# Patient Record
Sex: Female | Born: 2000
Health system: Southern US, Community
[De-identification: ages and names within clinical notes are randomized; demographics above are authoritative.]

---
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
## 2022-01-04 IMAGING — DX DG SACRUM/COCCYX 2+V
3 series · 3 of 3 positions shown · non-contrast
Comparison: None.

CLINICAL DATA: Coccyx pain.

EXAM:
SACRUM AND COCCYX - 2+ VIEW

[dg sacrum/coccyx (1 of 3)]
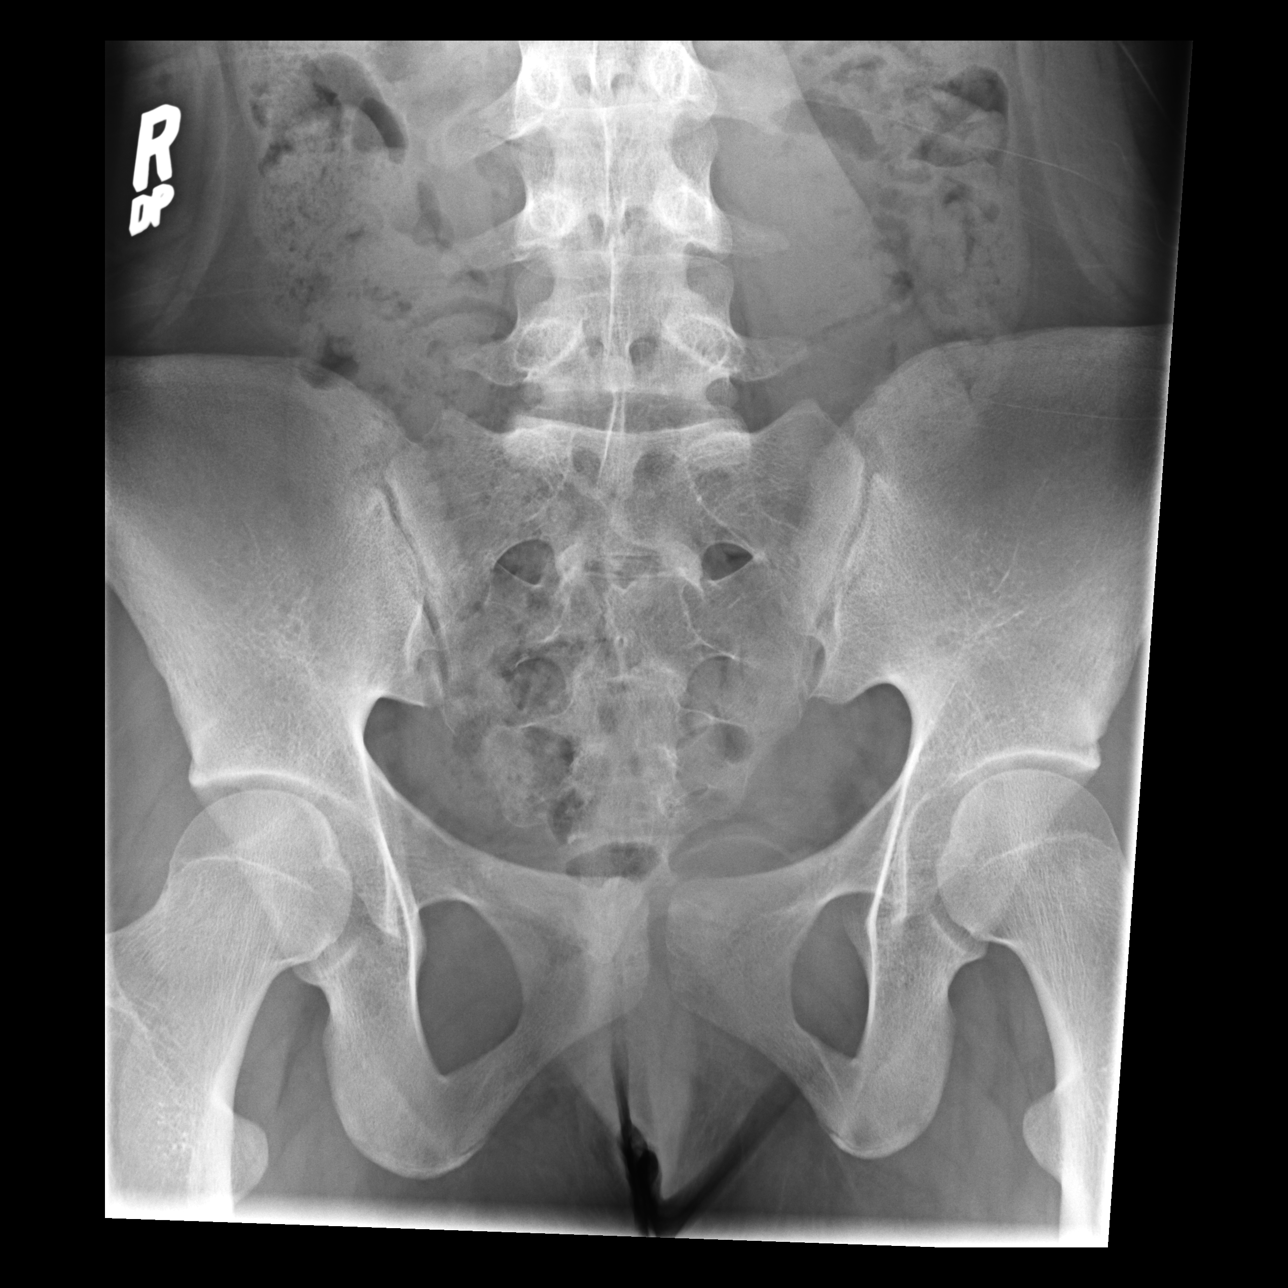

[dg sacrum/coccyx (2 of 3)]
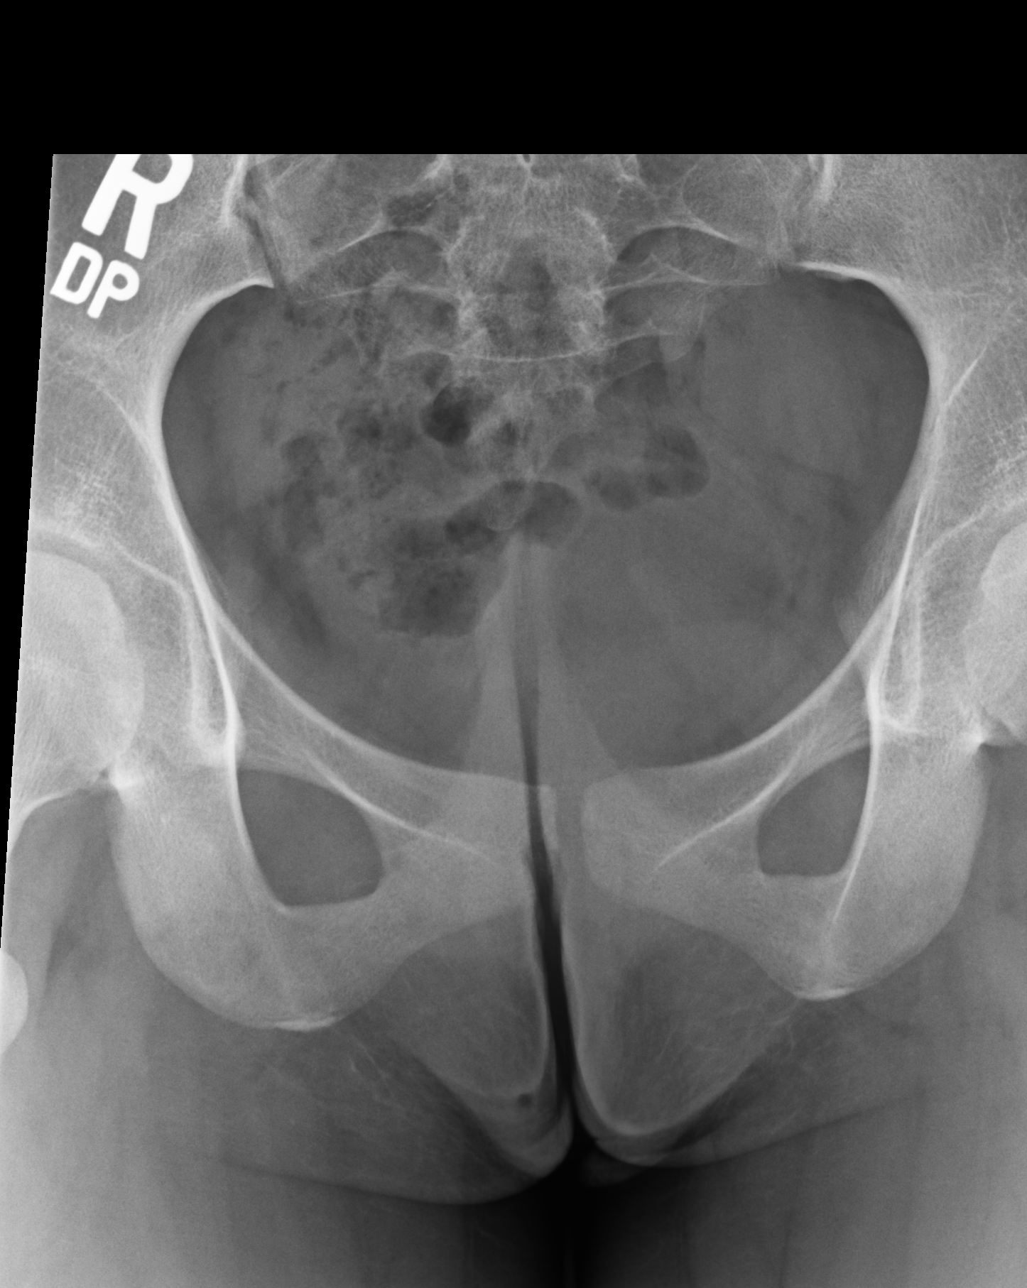

[dg sacrum/coccyx (3 of 3)]
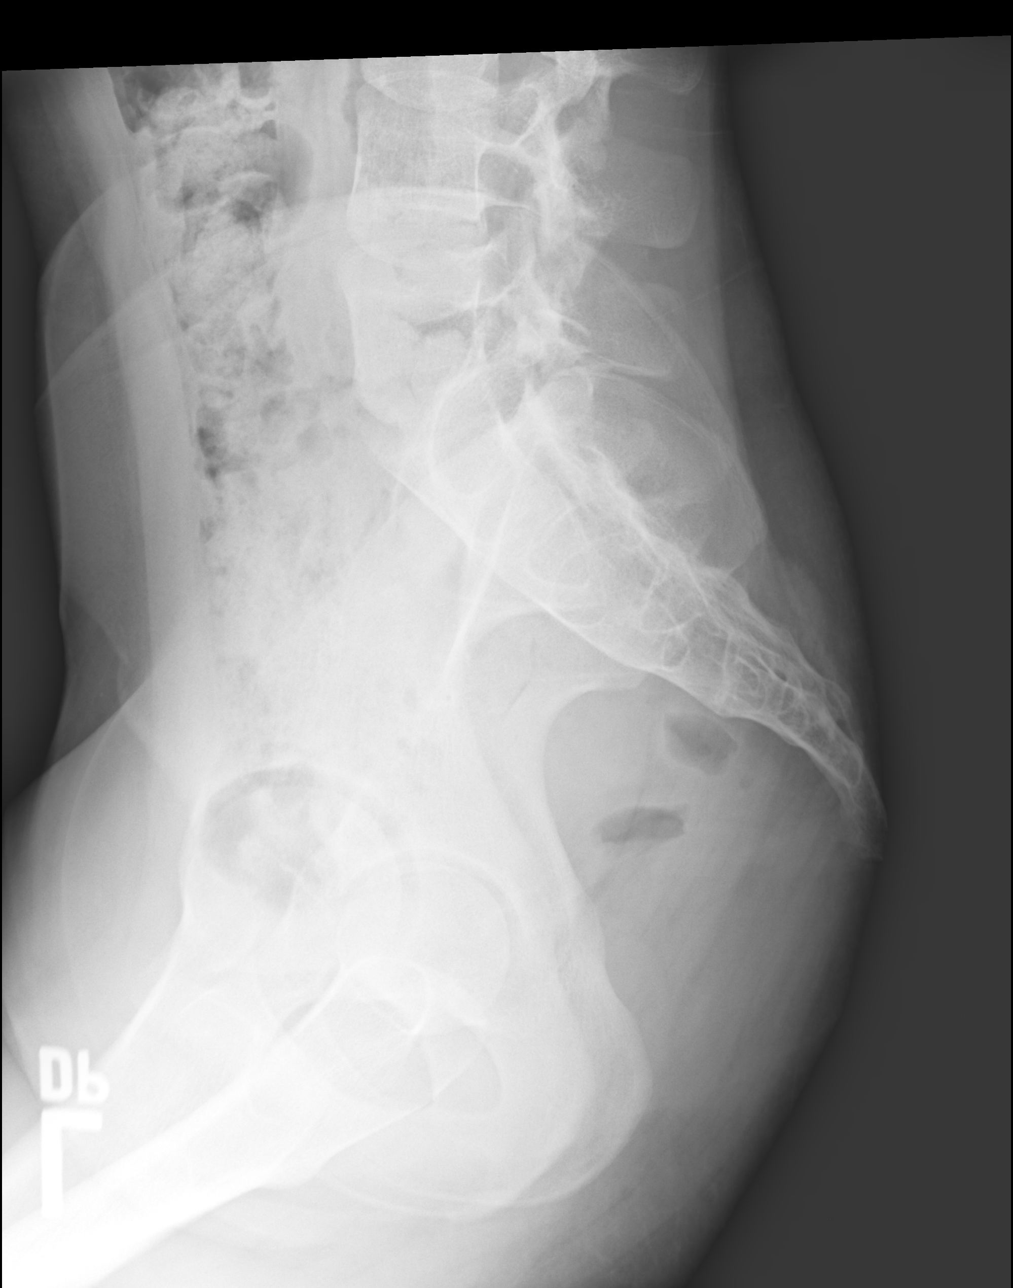

[3 of 3 positions shown; findings below may reference images not displayed]

FINDINGS: There is no evidence of fracture or other focal bone lesions.
IMPRESSION: Negative.

## 2022-01-30 ENCOUNTER — Other Ambulatory Visit: Payer: Self-pay | Admitting: Adult Health

## 2022-01-30 DIAGNOSIS — F428 Other obsessive-compulsive disorder: Secondary | ICD-10-CM

## 2022-01-30 DIAGNOSIS — F331 Major depressive disorder, recurrent, moderate: Secondary | ICD-10-CM

## 2022-01-30 DIAGNOSIS — F410 Panic disorder [episodic paroxysmal anxiety] without agoraphobia: Secondary | ICD-10-CM

## 2022-01-30 DIAGNOSIS — F411 Generalized anxiety disorder: Secondary | ICD-10-CM

## 2022-01-30 DIAGNOSIS — F909 Attention-deficit hyperactivity disorder, unspecified type: Secondary | ICD-10-CM

## 2022-03-23 ENCOUNTER — Telehealth: Payer: Self-pay | Admitting: Adult Health

## 2022-03-23 NOTE — Telephone Encounter (Signed)
Pt's mom LVM @ 4:34p.  She is on DPR.  She asked for refill of Venlafaxine to   [id number]- [place], [place] - [address], [id number]: [phone] Fax: [phone]  Next appt 3/21

## 2022-03-23 NOTE — Telephone Encounter (Signed)
Verify dose.   From 10/31 note: Effexor '75mg'$  daily and 37.'5mg'$ . 75 mg is the only one on her med list.

## 2022-03-24 NOTE — Telephone Encounter (Signed)
Mom called to request RF on venlafaxine. I called to verify dosing.   From last visit 10/31:  Effexor '75mg'$  daily and 37.'5mg'$    Mom said patient took two 75 mg tabs and 1 37.5 mg tab.  It looks like you discontinued the 37.5 mg.  Patient canceled F/U for November, but is scheduled for 3/21.

## 2022-03-24 NOTE — Telephone Encounter (Signed)
Ok - if that is working continue the two 75 mg tabs and 1 37.5 mg tab.

## 2022-03-25 ENCOUNTER — Other Ambulatory Visit: Payer: Self-pay | Admitting: Adult Health

## 2022-03-25 DIAGNOSIS — F411 Generalized anxiety disorder: Secondary | ICD-10-CM

## 2022-03-25 DIAGNOSIS — F428 Other obsessive-compulsive disorder: Secondary | ICD-10-CM

## 2022-03-25 DIAGNOSIS — F909 Attention-deficit hyperactivity disorder, unspecified type: Secondary | ICD-10-CM

## 2022-03-25 DIAGNOSIS — F331 Major depressive disorder, recurrent, moderate: Secondary | ICD-10-CM

## 2022-03-25 DIAGNOSIS — F410 Panic disorder [episodic paroxysmal anxiety] without agoraphobia: Secondary | ICD-10-CM

## 2022-03-25 NOTE — Telephone Encounter (Signed)
Received RF through interface and addressed that request.

## 2022-04-15 ENCOUNTER — Ambulatory Visit (INDEPENDENT_AMBULATORY_CARE_PROVIDER_SITE_OTHER): Payer: 59 | Admitting: Adult Health

## 2022-04-15 ENCOUNTER — Encounter: Payer: Self-pay | Admitting: Adult Health

## 2022-04-15 DIAGNOSIS — F909 Attention-deficit hyperactivity disorder, unspecified type: Secondary | ICD-10-CM

## 2022-04-15 DIAGNOSIS — F428 Other obsessive-compulsive disorder: Secondary | ICD-10-CM

## 2022-04-15 DIAGNOSIS — F410 Panic disorder [episodic paroxysmal anxiety] without agoraphobia: Secondary | ICD-10-CM | POA: Diagnosis not present

## 2022-04-15 DIAGNOSIS — F411 Generalized anxiety disorder: Secondary | ICD-10-CM | POA: Diagnosis not present

## 2022-04-15 DIAGNOSIS — F331 Major depressive disorder, recurrent, moderate: Secondary | ICD-10-CM

## 2022-04-15 MED ORDER — METHYLPHENIDATE HCL 20 MG PO TABS: 20.0000 mg | ORAL_TABLET | Freq: Every evening | ORAL | 0 refills | Status: DC

## 2022-04-15 MED ORDER — VENLAFAXINE HCL ER 75 MG PO CP24: ORAL_CAPSULE | ORAL | 2 refills | Status: DC

## 2022-04-15 NOTE — Progress Notes (Signed)
Madison Wang [id number] 2000/12/17 21 y.o.  Subjective:   Patient ID:  Madison Wang is a 22 y.o. (DOB Jan 29, 2000) female.  Chief Complaint: No chief complaint on file.   HPI Madison Wang presents to the office today for follow-up of GAD, MDD, panic attacks, obssessional thoughts, and ADHD.  Accompanied by mother - not present during interview.  Describes mood today as "ok". Pleasant. Denies tearfulness. Mood symptoms - reports depression, anxiety and irritability. Reports worry, rumination, and over thinking. Feels likes she has spikes of OCD, but it has consistent over the past few weeks. Continues to struggle with situational OCD - obsessive "tics" - "it has spiked". Reports one panic attack - forgot to take medications. Mood is consistent. Stating "I'm not doing as well as I was". Feels like the Effexor is helpful - reports some withdrawal symptoms when missing a dose. Did not tolerate Adderall - "too activating". Improved interest and motivation. Taking medications as prescribed. Energy levels stable. Active, has not been able to exercise like she was - recovering from PNA. Enjoys some usual interests and activities. Single. Not dating. Lives with parents - 2 dogs. Spending time with family and friends. Appetite adequate. Weight stable 133 pounds - 67". Sleeps well most nights. Averages 7 to 8 hours. Reports some daytime napping over the past month. Focus and concentration difficulties - tried Adderall and did not tolerate. Diagnosed with ADD in 3rd grade - re-evaluated in the 8th grade. Never started on medications, but has a 504 plan. Completing tasks. Managing aspects of household. 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  Denies SI or HI.  Denies AH or VH. Denies self harm. Denies substance use.  Previous medication trials: Zoloft  Review of Systems:  Review of Systems  Musculoskeletal:  Negative for gait problem.  Neurological:  Negative for tremors.   Psychiatric/Behavioral:         Please refer to HPI    Medications: I have reviewed the patient's current medications.  Current Outpatient Medications  Medication Sig Dispense Refill   methylphenidate (RITALIN) 20 MG tablet Take 1 tablet (20 mg total) by mouth every evening. 30 tablet 0   amphetamine-dextroamphetamine (ADDERALL) 20 MG tablet Take 1 tablet (20 mg total) by mouth daily. 30 tablet 0   [name] 3-0.02 MG tablet Take 1 tablet by mouth daily.     spironolactone (ALDACTONE) 50 MG tablet Take 50 mg by mouth daily.     venlafaxine XR (EFFEXOR-XR) 75 MG 24 hr capsule Take three capsules every morning. 90 capsule 2   No current facility-administered medications for this visit.    Medication Side Effects: None  Allergies:  Allergies  Allergen Reactions   Penicillin G Rash    Other reaction(s): Other unknown    Amoxicillin Other (See Comments)    unknown Other reaction(s): Rash   Penicillins     No past medical history on file.  Past Medical History, Surgical history, Social history, and Family history were reviewed and updated as appropriate.   Please see review of systems for further details on the patient's review from today.   Objective:   Physical Exam:  There were no vitals taken for this visit.  Physical Exam Constitutional:      General: She is not in acute distress. Musculoskeletal:        General: No deformity.  Neurological:     Mental Status: She is alert and oriented to person, place, and time.  Coordination: Coordination normal.  Psychiatric:        Attention and Perception: Attention and perception normal. She does not perceive auditory or visual hallucinations.        Mood and Affect: Mood normal. Mood is not anxious or depressed. Affect is not labile, blunt, angry or inappropriate.        Speech: Speech normal.        Behavior: Behavior normal.        Thought Content: Thought content normal. Thought content is not paranoid or delusional.  Thought content does not include homicidal or suicidal ideation. Thought content does not include homicidal or suicidal plan.        Cognition and Memory: Cognition and memory normal.        Judgment: Judgment normal.     Comments: Insight intact     Lab Review:  No results found for: "NA", "K", "CL", "CO2", "GLUCOSE", "BUN", "CREATININE", "CALCIUM", "PROT", "ALBUMIN", "AST", "ALT", "ALKPHOS", "BILITOT", "GFRNONAA", "GFRAA"  No results found for: "WBC", "RBC", "HGB", "HCT", "PLT", "MCV", "MCH", "MCHC", "RDW", "LYMPHSABS", "MONOABS", "EOSABS", "BASOSABS"  No results found for: "POCLITH", "LITHIUM"   No results found for: "PHENYTOIN", "PHENOBARB", "VALPROATE", "CBMZ"   .res Assessment: Plan:    Plan:  PDMP reviewed  Effexor 75mg  daily - 2 daily  D/C Adderall 20mg  daily   Consider NAC tabs  Reviewed Gene Sight testing    Consider ADHD medication     RTC 4 weeks  Patient advised to contact office with any questions, adverse effects, or acute worsening in signs and symptoms. Diagnoses and all orders for this visit:  Major depressive disorder, recurrent episode, moderate (HCC) -     venlafaxine XR (EFFEXOR-XR) 75 MG 24 hr capsule; Take three capsules every morning.  Panic attacks -     venlafaxine XR (EFFEXOR-XR) 75 MG 24 hr capsule; Take three capsules every morning.  Generalized anxiety disorder -     venlafaxine XR (EFFEXOR-XR) 75 MG 24 hr capsule; Take three capsules every morning.  Attention deficit hyperactivity disorder (ADHD), unspecified ADHD type -     venlafaxine XR (EFFEXOR-XR) 75 MG 24 hr capsule; Take three capsules every morning. -     methylphenidate (RITALIN) 20 MG tablet; Take 1 tablet (20 mg total) by mouth every evening.  Obsessional thoughts -     venlafaxine XR (EFFEXOR-XR) 75 MG 24 hr capsule; Take three capsules every morning.     Please see After Visit Summary for patient specific instructions.  No future appointments.  No orders of the  defined types were placed in this encounter.   -------------------------------

## 2022-04-18 ENCOUNTER — Telehealth: Payer: Self-pay

## 2022-04-18 NOTE — Telephone Encounter (Signed)
Prior Authorization initiated and submitted for Methylphenidate 20 mg #30/30 approval received effective through 04/17/2025 with [employer]

## 2022-05-13 ENCOUNTER — Other Ambulatory Visit: Payer: Self-pay | Admitting: Adult Health

## 2022-05-13 DIAGNOSIS — F909 Attention-deficit hyperactivity disorder, unspecified type: Secondary | ICD-10-CM

## 2022-05-13 DIAGNOSIS — F428 Other obsessive-compulsive disorder: Secondary | ICD-10-CM

## 2022-05-13 DIAGNOSIS — F410 Panic disorder [episodic paroxysmal anxiety] without agoraphobia: Secondary | ICD-10-CM

## 2022-05-13 DIAGNOSIS — F331 Major depressive disorder, recurrent, moderate: Secondary | ICD-10-CM

## 2022-05-13 DIAGNOSIS — F411 Generalized anxiety disorder: Secondary | ICD-10-CM

## 2022-05-13 NOTE — Telephone Encounter (Signed)
Has appt 4/25

## 2022-05-20 ENCOUNTER — Ambulatory Visit: Payer: 59 | Admitting: Adult Health

## 2022-06-12 ENCOUNTER — Other Ambulatory Visit: Payer: Self-pay | Admitting: Adult Health

## 2022-06-12 DIAGNOSIS — F331 Major depressive disorder, recurrent, moderate: Secondary | ICD-10-CM

## 2022-06-12 DIAGNOSIS — F909 Attention-deficit hyperactivity disorder, unspecified type: Secondary | ICD-10-CM

## 2022-06-12 DIAGNOSIS — F411 Generalized anxiety disorder: Secondary | ICD-10-CM

## 2022-06-12 DIAGNOSIS — F410 Panic disorder [episodic paroxysmal anxiety] without agoraphobia: Secondary | ICD-10-CM

## 2022-06-12 DIAGNOSIS — F428 Other obsessive-compulsive disorder: Secondary | ICD-10-CM

## 2022-06-13 NOTE — Telephone Encounter (Signed)
Please call to schedule appt, was a cancel/no show last visit.

## 2022-06-14 NOTE — Telephone Encounter (Signed)
Lvm for patient to call and schedule 

## 2022-09-13 ENCOUNTER — Other Ambulatory Visit: Payer: Self-pay | Admitting: Adult Health

## 2022-09-13 DIAGNOSIS — F331 Major depressive disorder, recurrent, moderate: Secondary | ICD-10-CM

## 2022-09-13 DIAGNOSIS — F411 Generalized anxiety disorder: Secondary | ICD-10-CM

## 2022-09-13 DIAGNOSIS — F410 Panic disorder [episodic paroxysmal anxiety] without agoraphobia: Secondary | ICD-10-CM

## 2022-09-13 DIAGNOSIS — F909 Attention-deficit hyperactivity disorder, unspecified type: Secondary | ICD-10-CM

## 2022-09-13 DIAGNOSIS — F428 Other obsessive-compulsive disorder: Secondary | ICD-10-CM

## 2022-11-04 ENCOUNTER — Other Ambulatory Visit: Payer: Self-pay | Admitting: Adult Health

## 2022-11-04 DIAGNOSIS — F428 Other obsessive-compulsive disorder: Secondary | ICD-10-CM

## 2022-11-04 DIAGNOSIS — F411 Generalized anxiety disorder: Secondary | ICD-10-CM

## 2022-11-04 DIAGNOSIS — F331 Major depressive disorder, recurrent, moderate: Secondary | ICD-10-CM

## 2022-11-04 DIAGNOSIS — F909 Attention-deficit hyperactivity disorder, unspecified type: Secondary | ICD-10-CM

## 2022-11-04 DIAGNOSIS — F410 Panic disorder [episodic paroxysmal anxiety] without agoraphobia: Secondary | ICD-10-CM

## 2022-11-10 ENCOUNTER — Ambulatory Visit: Payer: 59 | Admitting: Adult Health

## 2022-11-10 ENCOUNTER — Encounter: Payer: Self-pay | Admitting: Adult Health

## 2022-11-10 DIAGNOSIS — F331 Major depressive disorder, recurrent, moderate: Secondary | ICD-10-CM

## 2022-11-10 DIAGNOSIS — F410 Panic disorder [episodic paroxysmal anxiety] without agoraphobia: Secondary | ICD-10-CM

## 2022-11-10 DIAGNOSIS — F411 Generalized anxiety disorder: Secondary | ICD-10-CM

## 2022-11-10 DIAGNOSIS — F909 Attention-deficit hyperactivity disorder, unspecified type: Secondary | ICD-10-CM | POA: Diagnosis not present

## 2022-11-10 DIAGNOSIS — F428 Other obsessive-compulsive disorder: Secondary | ICD-10-CM

## 2022-11-10 MED ORDER — VENLAFAXINE HCL ER 75 MG PO CP24: ORAL_CAPSULE | ORAL | 1 refills | Status: DC

## 2022-11-10 NOTE — Progress Notes (Signed)
Madison Wang [id number] May 29, 2000 22 y.o.  Subjective:   Patient ID:  Madison Wang is a 22 y.o. (DOB 10/02/2000) female.  Chief Complaint: No chief complaint on file.   HPI BRITTON PERKINSON presents to the office today for follow-up of GAD, MDD, panic attacks, obssessional thoughts, and ADHD.  Describes mood today as "ok". Pleasant. Denies tearfulness. Mood symptoms - denies depression and irritability. Reports some anxiety - "irrational thoughts'. Denies panic attacks. Reports decreased worry, rumination, and over thinking - "it comes in waves". Reports obsessional thoughts and acts - "they are better". Mood is consistent. Stating "I feel like I'm doing ok". Feels like the Effexor continues to work well. Improved interest and motivation. Taking medications as prescribed. Energy levels stable. Active, has not been exercising. Enjoys some usual interests and activities. Single. Not dating. Lives with parents - 2 dogs. Spending time with family and friends. Appetite adequate. Weight stable 133 pounds - 67". Sleeps well most nights. Averages 7 to 8 hours. Reports some daytime napping. Focus and concentration difficulties at times. Diagnosed with ADD in 3rd grade - re-evaluated in the 8th grade. Completing tasks. Managing aspects of household. 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  Denies AH or VH. Denies self harm. Denies substance use.  Previous medication trials: Zoloft  Review of Systems:  Review of Systems  Musculoskeletal:  Negative for gait problem.  Neurological:  Negative for tremors.  Psychiatric/Behavioral:         Please refer to HPI    Medications: I have reviewed the patient's current medications.  Current Outpatient Medications  Medication Sig Dispense Refill   methylphenidate (RITALIN) 20 MG tablet Take 1 tablet (20 mg total) by mouth every evening. 30 tablet 0   [name] 3-0.02 MG tablet Take 1 tablet by mouth daily.     spironolactone  (ALDACTONE) 50 MG tablet Take 50 mg by mouth daily.     venlafaxine XR (EFFEXOR-XR) 75 MG 24 hr capsule TAKE 3 CAPSULES BY MOUTH EVERY MORNING 270 capsule 1   No current facility-administered medications for this visit.    Medication Side Effects: None  Allergies:  Allergies  Allergen Reactions   Penicillin G Rash    Other reaction(s): Other unknown    Amoxicillin Other (See Comments)    unknown Other reaction(s): Rash   Penicillins     No past medical history on file.  Past Medical History, Surgical history, Social history, and Family history were reviewed and updated as appropriate.   Please see review of systems for further details on the patient's review from today.   Objective:   Physical Exam:  There were no vitals taken for this visit.  Physical Exam Constitutional:      General: She is not in acute distress. Musculoskeletal:        General: No deformity.  Neurological:     Mental Status: She is alert and oriented to person, place, and time.     Coordination: Coordination normal.  Psychiatric:        Attention and Perception: Attention and perception normal. She does not perceive auditory or visual hallucinations.        Mood and Affect: Mood normal. Mood is not anxious or depressed. Affect is not labile, blunt, angry or inappropriate.        Speech: Speech normal.        Behavior: Behavior normal.        Thought Content: Thought content normal. Thought content  is not paranoid or delusional. Thought content does not include homicidal or suicidal ideation. Thought content does not include homicidal or suicidal plan.        Cognition and Memory: Cognition and memory normal.        Judgment: Judgment normal.     Comments: Insight intact     Lab Review:  No results found for: "NA", "K", "CL", "CO2", "GLUCOSE", "BUN", "CREATININE", "CALCIUM", "PROT", "ALBUMIN", "AST", "ALT", "ALKPHOS", "BILITOT", "GFRNONAA", "GFRAA"  No results found for: "WBC", "RBC", "HGB",  "HCT", "PLT", "MCV", "MCH", "MCHC", "RDW", "LYMPHSABS", "MONOABS", "EOSABS", "BASOSABS"  No results found for: "POCLITH", "LITHIUM"   No results found for: "PHENYTOIN", "PHENOBARB", "VALPROATE", "CBMZ"   .res Assessment: Plan:    Plan:  PDMP reviewed  Effexor XR 75mg  daily - 3 capsules daily  Consider NAC tabs  Reviewed Gene Sight testing    Consider ADHD medication     RTC 3 months  Patient advised to contact office with any questions, adverse effects, or acute worsening in signs and symptoms.  Diagnoses and all orders for this visit:  Major depressive disorder, recurrent episode, moderate (HCC) -     venlafaxine XR (EFFEXOR-XR) 75 MG 24 hr capsule; TAKE 3 CAPSULES BY MOUTH EVERY MORNING  Generalized anxiety disorder -     venlafaxine XR (EFFEXOR-XR) 75 MG 24 hr capsule; TAKE 3 CAPSULES BY MOUTH EVERY MORNING  Panic attacks -     venlafaxine XR (EFFEXOR-XR) 75 MG 24 hr capsule; TAKE 3 CAPSULES BY MOUTH EVERY MORNING  Attention deficit hyperactivity disorder (ADHD), unspecified ADHD type -     venlafaxine XR (EFFEXOR-XR) 75 MG 24 hr capsule; TAKE 3 CAPSULES BY MOUTH EVERY MORNING  Obsessional thoughts -     venlafaxine XR (EFFEXOR-XR) 75 MG 24 hr capsule; TAKE 3 CAPSULES BY MOUTH EVERY MORNING     Please see After Visit Summary for patient specific instructions.  No future appointments.   No orders of the defined types were placed in this encounter.   -------------------------------

## 2023-02-02 ENCOUNTER — Ambulatory Visit (HOSPITAL_COMMUNITY)
Admission: EM | Admit: 2023-02-02 | Discharge: 2023-02-02 | Disposition: A | Discharge status: Home / Self Care | Payer: 59 | Attending: Psychiatry | Admitting: Psychiatry

## 2023-02-02 DIAGNOSIS — F4323 Adjustment disorder with mixed anxiety and depressed mood: Secondary | ICD-10-CM | POA: Diagnosis not present

## 2023-02-02 MED ORDER — LORAZEPAM 1 MG PO TABS
1.0000 mg | ORAL_TABLET | Freq: Once | ORAL | Status: AC
  Administered 2023-02-02: 1 mg via ORAL
  Filled 2023-02-02: qty 1
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
## 2023-02-02 NOTE — Discharge Instructions (Signed)
 You are encouraged to follow up with [facility] for outpatient treatment.  Walk in/ Open Access Hours: Monday - Friday 8AM - 11AM (To see provider and therapist) Friday - 1PM - 4PM (To see therapist only)  [facility] [address] [place], [place] [phone]

## 2023-02-02 NOTE — Progress Notes (Signed)
   02/02/23 1922  [facility] Triage Screening (Walk-ins at [facility] only)  How Did You Hear About Us ? Family/Friend  What Is the Reason for Your Visit/Call Today? Pt presents to [facility] as a voluntary walk-in, with complaint of panic attacks and anxiety. Pt was observed being very tearful, breathing heavily/hyperventilating during triage process. Pt reports that the loss of a friendship/relationship earlier today as immediate stressor. Pt reports history of depression and anxiety. Pt is currently prescribed Venlafaxine  and is compliant with medication. Pt is not established with outpatient services at this time. Pt denies prior psychiatric hospitalizations and/or suicide attempts. Pt currently denies SI,HI,AVH and substance/alcohol use.  How Long Has This Been Causing You Problems? <Week  Have You Recently Had Any Thoughts About Hurting Yourself? No  Are You Planning to Commit Suicide/Harm Yourself At This time? No  Have you Recently Had Thoughts About Hurting Someone Sherral? No  Are You Planning To Harm Someone At This Time? No  Physical Abuse Denies  Verbal Abuse Denies  Sexual Abuse Denies  Exploitation of patient/patient's resources Denies  Self-Neglect Denies  Are you currently experiencing any auditory, visual or other hallucinations? No  Have You Used Any Alcohol or Drugs in the Past 24 Hours? No  Do you have any current medical co-morbidities that require immediate attention? No  Clinician description of patient physical appearance/behavior: very tearful, cooperative  What Do You Feel Would Help You the Most Today? Treatment for Depression or other mood problem  If access to [facility] was not available, would you have sought care in the Emergency Department? No  Determination of Need Routine (7 days)  Options For Referral Outpatient Therapy;Medication Management;Other: Comment

## 2023-02-10 ENCOUNTER — Ambulatory Visit: Payer: Self-pay | Admitting: Adult Health

## 2023-02-10 ENCOUNTER — Ambulatory Visit (INDEPENDENT_AMBULATORY_CARE_PROVIDER_SITE_OTHER): Payer: Self-pay | Admitting: Adult Health

## 2023-02-10 DIAGNOSIS — Z0389 Encounter for observation for other suspected diseases and conditions ruled out: Secondary | ICD-10-CM

## 2023-02-10 NOTE — Progress Notes (Signed)
Patient no show appointment. ? ?

## 2023-02-21 ENCOUNTER — Encounter: Payer: Self-pay | Admitting: Adult Health

## 2023-02-21 ENCOUNTER — Telehealth: Payer: 59 | Admitting: Adult Health

## 2023-02-21 DIAGNOSIS — F331 Major depressive disorder, recurrent, moderate: Secondary | ICD-10-CM

## 2023-02-21 DIAGNOSIS — F909 Attention-deficit hyperactivity disorder, unspecified type: Secondary | ICD-10-CM | POA: Diagnosis not present

## 2023-02-21 DIAGNOSIS — F410 Panic disorder [episodic paroxysmal anxiety] without agoraphobia: Secondary | ICD-10-CM | POA: Diagnosis not present

## 2023-02-21 DIAGNOSIS — F428 Other obsessive-compulsive disorder: Secondary | ICD-10-CM

## 2023-02-21 DIAGNOSIS — F411 Generalized anxiety disorder: Secondary | ICD-10-CM | POA: Diagnosis not present

## 2023-02-21 MED ORDER — VENLAFAXINE HCL ER 75 MG PO CP24: ORAL_CAPSULE | ORAL | 1 refills | Status: DC

## 2023-02-21 MED ORDER — LORAZEPAM 0.5 MG PO TABS: 0.5000 mg | ORAL_TABLET | Freq: Every day | ORAL | 0 refills | Status: DC | PRN

## 2023-02-21 NOTE — Progress Notes (Signed)
Madison Wang [id number] October 28, 2000 22 y.o.  Virtual Visit via Video Note  I connected with pt @ on 02/21/23 at 11:30 AM EST by a video enabled telemedicine application and verified that I am speaking with the correct person using two identifiers.   I discussed the limitations of evaluation and management by telemedicine and the availability of in person appointments. The patient expressed understanding and agreed to proceed.  I discussed the assessment and treatment plan with the patient. The patient was provided an opportunity to ask questions and all were answered. The patient agreed with the plan and demonstrated an understanding of the instructions.   The patient was advised to call back or seek an in-person evaluation if the symptoms worsen or if the condition fails to improve as anticipated.  I provided 25 minutes of non-face-to-face time during this encounter.  The patient was located at home.  The provider was located at [facility].   Dorothyann Gibbs, NP   Subjective:   Patient ID:  Madison Wang is a 24 y.o. (DOB [birth year]) female.  Chief Complaint: No chief complaint on file.   HPI [employer] presents for follow-up of GAD, MDD, panic attacks, obssessional thoughts, and ADHD.  Describes mood today as "ok". Pleasant. Denies tearfulness. Mood symptoms - denies depression and irritability. Reports some anxiety - "irrational thoughts'. Denies panic attacks. Reports decreased worry, rumination, and over thinking - "it comes in waves". Reports obsessional thoughts and acts - "they are better". Mood is consistent. Stating "I feel like I'm doing ok". Feels like the Effexor continues to work well. Improved interest and motivation. Taking medications as prescribed. Energy levels stable. Active, has not been exercising. Enjoys some usual interests and activities. Single. Not dating. Lives with parents - 2 dogs. Spending time with family and friends. Appetite  adequate. Weight stable 133 pounds - 67". Sleeps well most nights. Averages 7 to 8 hours. Reports some daytime napping. Focus and concentration difficulties at times. Diagnosed with ADD in 3rd grade - re-evaluated in the 8th grade. Completing tasks. Managing aspects of household. 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  Denies AH or VH. Denies self harm. Denies substance use.  Previous medication trials: Zoloft   Review of Systems: GAD, MDD, panic attacks, obssessional thoughts, and ADHD.  Describes mood today as "ok". Pleasant. Denies tearfulness. Mood symptoms - denies depression and irritability. Stable interest and motivation. Reports anxiety. Reports recent panic attacks - seen in the ED and treated with Ativan. Reports worry, rumination, and over thinking. Reports some obsessional thoughts and acts. Mood is stable. Stating "I feel like I'm doing alright". Feels like the Effexor continues to work well. Would like to discuss having Ativan as needed for panic attacks. Taking medications as prescribed. Energy levels stable. Active, has not been exercising. Enjoys some usual interests and activities. Single. Lives with parents - 2 dogs. Spending time with family and friends. Appetite adequate. Weight gain 133 to 150 pounds -  67". Sleeps well most nights. Averages 8 to 9 hours.  Focus and concentration mostly stable. Diagnosed with ADD in 3rd grade - re-evaluated in the 8th grade. Completing tasks. Managing aspects of household. [employer] at [employer] and art class.  Denies SI or HI.  Denies AH or VH. Denies self harm. Denies substance use.  Previous medication trials: Zoloft Review of Systems  Musculoskeletal:  Negative for gait problem.  Neurological:  Negative for tremors.  Psychiatric/Behavioral:  Please refer to HPI    Medications: I have reviewed the patient's current medications.  Current Outpatient Medications  Medication Sig Dispense Refill    methylphenidate (RITALIN) 20 MG tablet Take 1 tablet (20 mg total) by mouth every evening. 30 tablet 0   [name] 3-0.02 MG tablet Take 1 tablet by mouth daily.     spironolactone (ALDACTONE) 50 MG tablet Take 50 mg by mouth daily.     venlafaxine XR (EFFEXOR-XR) 75 MG 24 hr capsule TAKE 3 CAPSULES BY MOUTH EVERY MORNING 270 capsule 1   No current facility-administered medications for this visit.    Medication Side Effects: None  Allergies:  Allergies  Allergen Reactions   Penicillin G Rash    Other reaction(s): Other unknown    Amoxicillin Other (See Comments)    unknown Other reaction(s): Rash   Penicillins     No past medical history on file.  No family history on file.  Social History   Socioeconomic History   Marital status: Single    Spouse name: Not on file   Number of children: Not on file   Years of education: Not on file   Highest education level: Not on file  Occupational History   Not on file  Tobacco Use   Smoking status: Never   Smokeless tobacco: Never  Substance and Sexual Activity   Alcohol use: Not on file   Drug use: Not on file   Sexual activity: Not on file  Other Topics Concern   Not on file  Social History Narrative   Not on file   Social Drivers of Health   Financial Resource Strain: Low Risk  (11/09/2021)   Received from [facility], [facility]   Overall Financial Resource Strain (CARDIA)    Difficulty of Paying Living Expenses: Not hard at all  Food Insecurity: Low Risk  (09/14/2022)   Received from [facility]   Hunger Vital Sign    Worried About Running Out of Food in the Last Year: Never true    Ran Out of Food in the Last Year: Never true  Transportation Needs: Not on file (09/14/2022)  Physical Activity: Sufficiently Active (11/09/2021)   Received from [facility], [facility]   Exercise Vital Sign    Days of Exercise per Week: 3 days    Minutes of Exercise per Session: 60 min  Stress: Stress Concern Present  (11/09/2021)   Received from [facility], [facility] - Occupational Stress Questionnaire    Feeling of Stress : To some extent  Social Connections: Socially Integrated (11/09/2021)   Received from [facility], [facility]   Social Network    How would you rate your social network (family, work, friends)?: Good participation with social networks  Intimate Partner Violence: Not At Risk (11/09/2021)   Received from [facility], [facility]   HITS    Over the last 12 months how often did your partner physically hurt you?: Never    Over the last 12 months how often did your partner insult you or talk down to you?: Never    Over the last 12 months how often did your partner threaten you with physical harm?: Never    Over the last 12 months how often did your partner scream or curse at you?: Never    Past Medical History, Surgical history, Social history, and Family history were reviewed and updated as appropriate.   Please see review of systems for further  details on the patient's review from today.   Objective:   Physical Exam:  There were no vitals taken for this visit.  Physical Exam Constitutional:      General: She is not in acute distress. Musculoskeletal:        General: No deformity.  Neurological:     Mental Status: She is alert and oriented to person, place, and time.     Coordination: Coordination normal.  Psychiatric:        Attention and Perception: Attention and perception normal. She does not perceive auditory or visual hallucinations.        Mood and Affect: Mood normal. Mood is not anxious or depressed. Affect is not labile, blunt, angry or inappropriate.        Speech: Speech normal.        Behavior: Behavior normal.        Thought Content: Thought content normal. Thought content is not paranoid or delusional. Thought content does not include homicidal or suicidal ideation. Thought content does not include  homicidal or suicidal plan.        Cognition and Memory: Cognition and memory normal.        Judgment: Judgment normal.     Comments: Insight intact     Lab Review:  No results found for: "NA", "K", "CL", "CO2", "GLUCOSE", "BUN", "CREATININE", "CALCIUM", "PROT", "ALBUMIN", "AST", "ALT", "ALKPHOS", "BILITOT", "GFRNONAA", "GFRAA"  No results found for: "WBC", "RBC", "HGB", "HCT", "PLT", "MCV", "MCH", "MCHC", "RDW", "LYMPHSABS", "MONOABS", "EOSABS", "BASOSABS"  No results found for: "POCLITH", "LITHIUM"   No results found for: "PHENYTOIN", "PHENOBARB", "VALPROATE", "CBMZ"   .res Assessment: Plan:    Plan:  PDMP reviewed  Add Ativan 0.5mg  daily as needed for panic attacks.  Effexor XR 75mg  daily - 3 capsules daily  Consider NAC tabs  Reviewed Gene Sight testing    Consider ADHD medication     RTC 2 months  Patient advised to contact office with any questions, adverse effects, or acute worsening in signs and symptoms.  There are no diagnoses linked to this encounter.   Please see After Visit Summary for patient specific instructions.  No future appointments.  No orders of the defined types were placed in this encounter.     -------------------------------

## 2023-04-11 ENCOUNTER — Other Ambulatory Visit: Payer: Self-pay

## 2023-04-11 ENCOUNTER — Telehealth: Payer: Self-pay | Admitting: Adult Health

## 2023-04-11 DIAGNOSIS — F410 Panic disorder [episodic paroxysmal anxiety] without agoraphobia: Secondary | ICD-10-CM

## 2023-04-11 MED ORDER — LORAZEPAM 0.5 MG PO TABS: 0.5000 mg | ORAL_TABLET | Freq: Every day | ORAL | 0 refills | Status: DC | PRN

## 2023-04-11 NOTE — Telephone Encounter (Signed)
 Lynn-mom called requesting Rx for Ativan to [facility] [address]

## 2023-04-11 NOTE — Telephone Encounter (Signed)
 Pended Ativan to [facility]

## 2023-04-19 ENCOUNTER — Ambulatory Visit: Admitting: Adult Health

## 2023-04-19 ENCOUNTER — Encounter: Payer: Self-pay | Admitting: Adult Health

## 2023-04-19 ENCOUNTER — Telehealth: Admitting: Adult Health

## 2023-04-19 DIAGNOSIS — F410 Panic disorder [episodic paroxysmal anxiety] without agoraphobia: Secondary | ICD-10-CM

## 2023-04-19 DIAGNOSIS — F909 Attention-deficit hyperactivity disorder, unspecified type: Secondary | ICD-10-CM | POA: Diagnosis not present

## 2023-04-19 DIAGNOSIS — F331 Major depressive disorder, recurrent, moderate: Secondary | ICD-10-CM | POA: Diagnosis not present

## 2023-04-19 DIAGNOSIS — F411 Generalized anxiety disorder: Secondary | ICD-10-CM

## 2023-04-19 DIAGNOSIS — F428 Other obsessive-compulsive disorder: Secondary | ICD-10-CM

## 2023-04-19 MED ORDER — VENLAFAXINE HCL ER 75 MG PO CP24: ORAL_CAPSULE | ORAL | 1 refills | Status: DC

## 2023-04-19 MED ORDER — LORAZEPAM 0.5 MG PO TABS: 0.5000 mg | ORAL_TABLET | Freq: Every day | ORAL | 2 refills | Status: DC | PRN

## 2023-04-19 NOTE — Progress Notes (Signed)
 Madison Wang [id number] February 08, 2000 23 y.o.  Virtual Visit via Video Note  I connected with pt @ on 04/19/23 at  5:00 PM EDT by a video enabled telemedicine application and verified that I am speaking with the correct person using two identifiers.   I discussed the limitations of evaluation and management by telemedicine and the availability of in person appointments. The patient expressed understanding and agreed to proceed.  I discussed the assessment and treatment plan with the patient. The patient was provided an opportunity to ask questions and all were answered. The patient agreed with the plan and demonstrated an understanding of the instructions.   The patient was advised to call back or seek an in-person evaluation if the symptoms worsen or if the condition fails to improve as anticipated.  I provided 25 minutes of non-face-to-face time during this encounter.  The patient was located at home.  The provider was located at [facility].   Madison Gibbs, NP   Subjective:   Patient ID:  Madison Wang is a 23 y.o. (DOB 03-Jun-2000) female.  Chief Complaint: No chief complaint on file.   HPI Madison Wang presents for follow-up of GAD, MDD, panic attacks, obssessional thoughts, and ADHD.  Describes mood today as "ok". Pleasant. Reports tearfulness - break up. Mood symptoms - denies depression, anxiety and irritability. Reports varying interest and motivation. Reports one recent panic attack - taking Ativan as needed. Reports increased worry, rumination and over thinking - "more so with recent break up". Reports obsessional thoughts and acts - "I have it at home a lot more". Reports a recent break up. Mood is variable - "up and down". Stating "overall, I feel like I'm doing fine". Feels like the Effexor continues to work well. Taking medications as prescribed. Energy levels lower. Active, has started going back to the gym.  Enjoys some usual interests and activities.  Single. Lives with parents - 2 dogs. Spending time with family and friends. Appetite adequate. Weight gain 133 to 155 pounds - 67". Sleeps well most nights. Averages 9 hours. Reports some daytime napping. Reports stable focus and concentration. Diagnosed with ADD in 3rd grade - re-evaluated in the 8th grade. Completing tasks. Managing aspects of household. Sophomore in college - [employer]. Looking or a job.  Denies SI or HI.  Denies AH or VH. Denies self harm. Denies substance use.  Previous medication trials: Zoloft  Review of Systems:  Review of Systems  Musculoskeletal:  Negative for gait problem.  Neurological:  Negative for tremors.  Psychiatric/Behavioral:         Please refer to HPI    Medications: I have reviewed the patient's current medications.  Current Outpatient Medications  Medication Sig Dispense Refill   LORazepam (ATIVAN) 0.5 MG tablet Take 1 tablet (0.5 mg total) by mouth daily as needed for anxiety. 15 tablet 2   [name] 3-0.02 MG tablet Take 1 tablet by mouth daily.     spironolactone (ALDACTONE) 50 MG tablet Take 50 mg by mouth daily.     venlafaxine XR (EFFEXOR-XR) 75 MG 24 hr capsule TAKE 3 CAPSULES BY MOUTH EVERY MORNING 270 capsule 1   No current facility-administered medications for this visit.    Medication Side Effects: None  Allergies:  Allergies  Allergen Reactions   Penicillin G Rash    Other reaction(s): Other unknown    Amoxicillin Other (See Comments)    unknown Other reaction(s): Rash   Penicillins     No past medical history  on file.  No family history on file.  Social History   Socioeconomic History   Marital status: Single    Spouse name: Not on file   Number of children: Not on file   Years of education: Not on file   Highest education level: Not on file  Occupational History   Not on file  Tobacco Use   Smoking status: Never   Smokeless tobacco: Never  Substance and Sexual Activity   Alcohol use: Not on file   Drug use:  Not on file   Sexual activity: Not on file  Other Topics Concern   Not on file  Social History Narrative   Not on file   Social Drivers of Health   Financial Resource Strain: Low Risk  (11/09/2021)   Received from [facility], [facility]   Overall Financial Resource Strain (CARDIA)    Difficulty of Paying Living Expenses: Not hard at all  Food Insecurity: Low Risk  (09/14/2022)   Received from [facility]   Hunger Vital Sign    Worried About Running Out of Food in the Last Year: Never true    Ran Out of Food in the Last Year: Never true  Transportation Needs: Not on file (09/14/2022)  Physical Activity: Sufficiently Active (11/09/2021)   Received from [facility], [facility]   Exercise Vital Sign    Days of Exercise per Week: 3 days    Minutes of Exercise per Session: 60 min  Stress: Stress Concern Present (11/09/2021)   Received from [facility], [facility] - Occupational Stress Questionnaire    Feeling of Stress : To some extent  Social Connections: Socially Integrated (11/09/2021)   Received from [facility], [facility]   Social Network    How would you rate your social network (family, work, friends)?: Good participation with social networks  Intimate Partner Violence: Not At Risk (11/09/2021)   Received from [facility], [facility]   HITS    Over the last 12 months how often did your partner physically hurt you?: Never    Over the last 12 months how often did your partner insult you or talk down to you?: Never    Over the last 12 months how often did your partner threaten you with physical harm?: Never    Over the last 12 months how often did your partner scream or curse at you?: Never    Past Medical History, Surgical history, Social history, and Family history were reviewed and updated as appropriate.   Please see review of systems for further details on the patient's review from today.    Objective:   Physical Exam:  There were no vitals taken for this visit.  Physical Exam Constitutional:      General: She is not in acute distress. Musculoskeletal:        General: No deformity.  Neurological:     Mental Status: She is alert and oriented to person, place, and time.     Coordination: Coordination normal.  Psychiatric:        Attention and Perception: Attention and perception normal. She does not perceive auditory or visual hallucinations.        Mood and Affect: Affect is not labile, blunt, angry or inappropriate.        Speech: Speech normal.        Behavior: Behavior normal.        Thought Content: Thought content normal. Thought content is not  paranoid or delusional. Thought content does not include homicidal or suicidal ideation. Thought content does not include homicidal or suicidal plan.        Cognition and Memory: Cognition and memory normal.        Judgment: Judgment normal.     Comments: Insight intact     Lab Review:  No results found for: "NA", "K", "CL", "CO2", "GLUCOSE", "BUN", "CREATININE", "CALCIUM", "PROT", "ALBUMIN", "AST", "ALT", "ALKPHOS", "BILITOT", "GFRNONAA", "GFRAA"  No results found for: "WBC", "RBC", "HGB", "HCT", "PLT", "MCV", "MCH", "MCHC", "RDW", "LYMPHSABS", "MONOABS", "EOSABS", "BASOSABS"  No results found for: "POCLITH", "LITHIUM"   No results found for: "PHENYTOIN", "PHENOBARB", "VALPROATE", "CBMZ"   .res Assessment: Plan:    Plan:  PDMP reviewed  Ativan 0.5mg  daily as needed for panic attacks.  Effexor XR 75mg  daily - 3 capsules daily  Reviewed Gene Sight testing    Consider ADHD medication   Working with a therapist.    RTC 2 months  25 minutes spent dedicated to the care of this patient on the date of this encounter to include pre-visit review of records, ordering of medication, post visit documentation, and face-to-face time with the patient discussing GAD, MDD, panic attacks, obssessional thoughts and ADHD.  Discussed continuing current medication regimen.  Discussed potential benefits, risk, and side effects of benzodiazepines to include potential risk of tolerance and dependence, as well as possible drowsiness.  Advised patient not to drive if experiencing drowsiness and to take lowest possible effective dose to minimize risk of dependence and tolerance.   Patient advised to contact office with any questions, adverse effects, or acute worsening in signs and symptoms.  Diagnoses and all orders for this visit:  Major depressive disorder, recurrent episode, moderate (HCC) -     venlafaxine XR (EFFEXOR-XR) 75 MG 24 hr capsule; TAKE 3 CAPSULES BY MOUTH EVERY MORNING  Generalized anxiety disorder -     venlafaxine XR (EFFEXOR-XR) 75 MG 24 hr capsule; TAKE 3 CAPSULES BY MOUTH EVERY MORNING  Panic attacks -     LORazepam (ATIVAN) 0.5 MG tablet; Take 1 tablet (0.5 mg total) by mouth daily as needed for anxiety. -     venlafaxine XR (EFFEXOR-XR) 75 MG 24 hr capsule; TAKE 3 CAPSULES BY MOUTH EVERY MORNING  Attention deficit hyperactivity disorder (ADHD), unspecified ADHD type -     venlafaxine XR (EFFEXOR-XR) 75 MG 24 hr capsule; TAKE 3 CAPSULES BY MOUTH EVERY MORNING  Obsessional thoughts -     venlafaxine XR (EFFEXOR-XR) 75 MG 24 hr capsule; TAKE 3 CAPSULES BY MOUTH EVERY MORNING     Please see After Visit Summary for patient specific instructions.  No future appointments.   No orders of the defined types were placed in this encounter.     -------------------------------

## 2023-05-20 IMAGING — DX DG CHEST 2V
2 series · 2 of 2 positions shown · non-contrast
Comparison: 10/07/2004

CLINICAL DATA: Wheezing.

EXAM:
CHEST - 2 VIEW

[dg chest 2 view (1 of 2)]
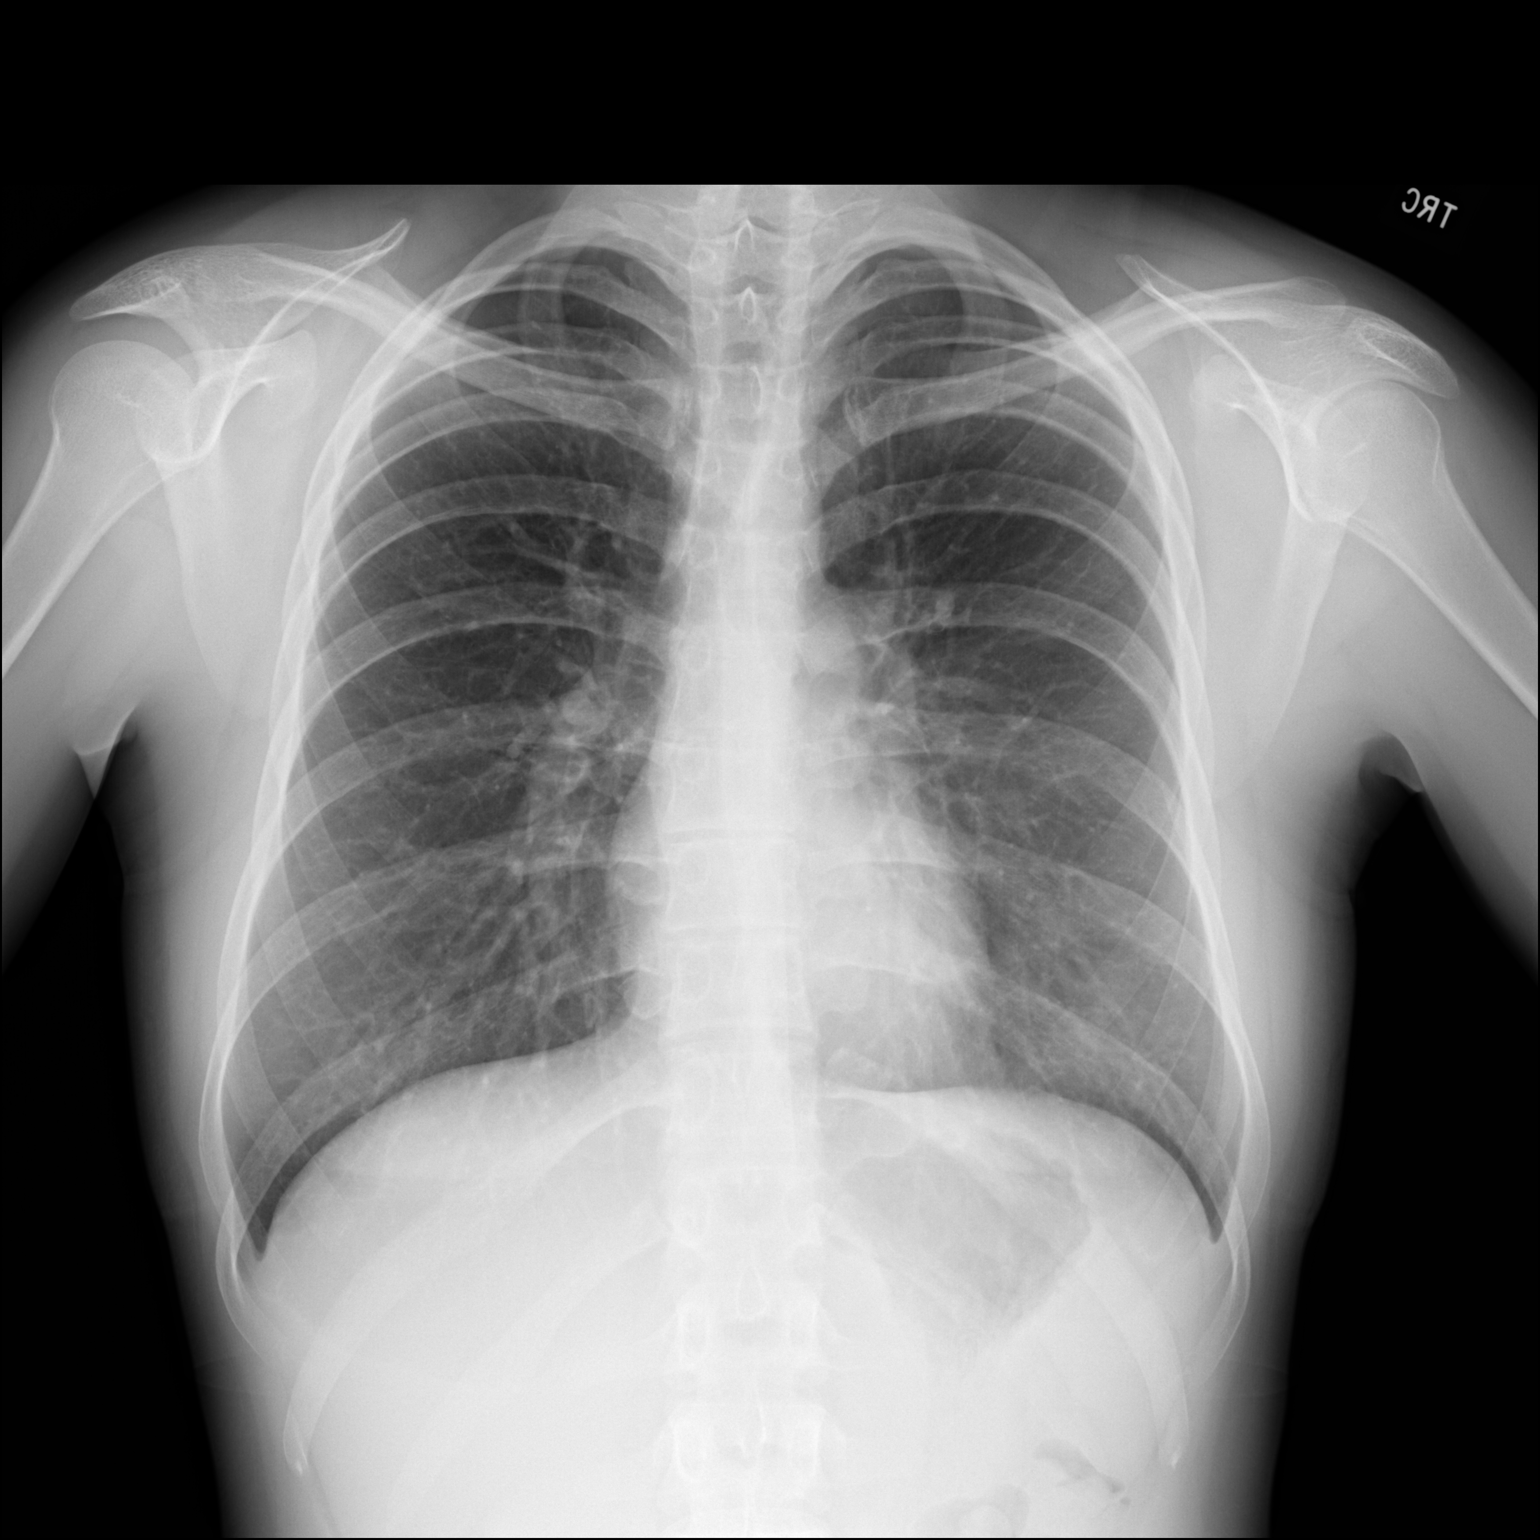

[dg chest 2 view (2 of 2)]
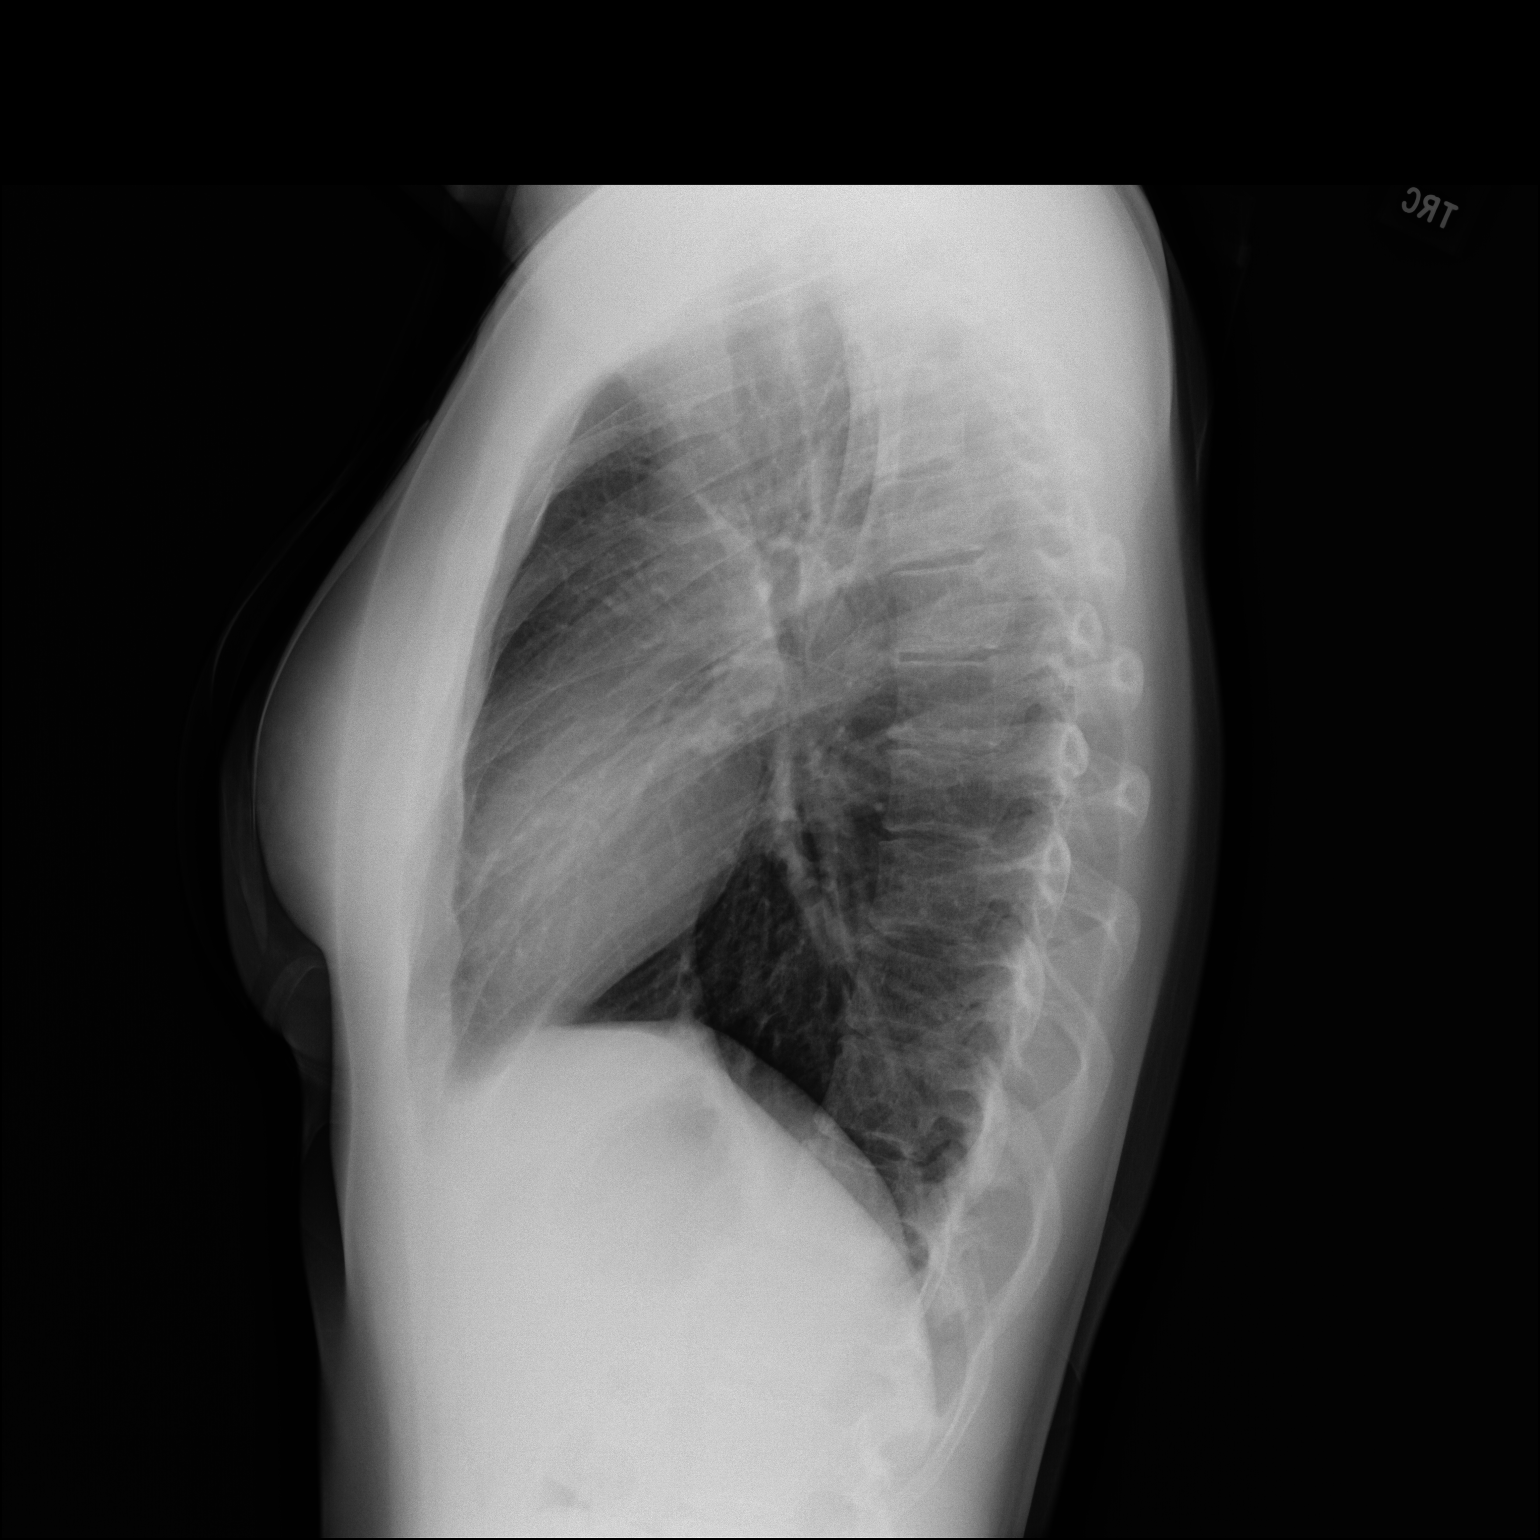

[2 of 2 positions shown; findings below may reference images not displayed]

FINDINGS: The heart size and mediastinal contours are within normal limits.
Both lungs are clear. The visualized skeletal structures are
unremarkable.
IMPRESSION: No active cardiopulmonary disease.

## 2023-05-27 ENCOUNTER — Encounter: Payer: Self-pay | Admitting: Adult Health

## 2023-05-27 ENCOUNTER — Telehealth (INDEPENDENT_AMBULATORY_CARE_PROVIDER_SITE_OTHER): Admitting: Adult Health

## 2023-05-27 DIAGNOSIS — F411 Generalized anxiety disorder: Secondary | ICD-10-CM | POA: Diagnosis not present

## 2023-05-27 DIAGNOSIS — F909 Attention-deficit hyperactivity disorder, unspecified type: Secondary | ICD-10-CM | POA: Diagnosis not present

## 2023-05-27 DIAGNOSIS — F410 Panic disorder [episodic paroxysmal anxiety] without agoraphobia: Secondary | ICD-10-CM

## 2023-05-27 DIAGNOSIS — F331 Major depressive disorder, recurrent, moderate: Secondary | ICD-10-CM | POA: Diagnosis not present

## 2023-05-27 DIAGNOSIS — F428 Other obsessive-compulsive disorder: Secondary | ICD-10-CM

## 2023-05-27 MED ORDER — METHYLPHENIDATE HCL 10 MG PO TABS: 10.0000 mg | ORAL_TABLET | Freq: Two times a day (BID) | ORAL | 0 refills | Status: DC

## 2023-05-27 NOTE — Progress Notes (Signed)
 Madison Wang [id number] [birth year] 22 y.o.  Virtual Visit via Video Note  I connected with pt @ on 05/27/23 at 11:30 AM EDT by a video enabled telemedicine application and verified that I am speaking with the correct person using two identifiers.   I discussed the limitations of evaluation and management by telemedicine and the availability of in person appointments. The patient expressed understanding and agreed to proceed.  I discussed the assessment and treatment plan with the patient. The patient was provided an opportunity to ask questions and all were answered. The patient agreed with the plan and demonstrated an understanding of the instructions.   The patient was advised to call back or seek an in-person evaluation if the symptoms worsen or if the condition fails to improve as anticipated.  I provided 25 minutes of non-face-to-face time during this encounter.  The patient was located at home.  The provider was located at [facility].   Reagan Camera, NP   Subjective:   Patient ID:  Madison Wang is a 23 y.o. (DOB 08/05/2000) female.  Chief Complaint: No chief complaint on file.   HPI [employer] presents for follow-up of GAD, MDD, panic attacks, obssessional thoughts, and ADHD.  Describes mood today as "ok". Pleasant. Reports decreased tearfulness. Mood symptoms - denies depression, anxiety and irritability. Reports stable interest and motivation. Denies recent panic attack.. Reports decreased worry, rumination and over thinking. Reports decreased obsessional thoughts and acts. Reports mood as improved. Stating  I feel like I'm doing better than I was". Taking medications as prescribed. Energy levels lower. Active, has started going back to the gym.  Enjoys some usual interests and activities. Single. Lives with parents - 2 dogs. Spending time with family and friends. Appetite adequate. Weight loss 140 from 155 pounds - 67". Sleeps well most nights.  Averages 9 hours. Reports some daytime napping. Reports stable focus and concentration. Diagnosed with ADD in 3rd grade - re-evaluated in the 8th grade. Completing tasks. Managing aspects of household. Sophomore in college - [employer]. Working 2 jobs. Denies SI or HI.  Denies AH or VH. Denies self harm. Denies substance use.  Previous medication trials: Zoloft    Review of Systems:  Review of Systems  Musculoskeletal:  Negative for gait problem.  Neurological:  Negative for tremors.  Psychiatric/Behavioral:         Please refer to HPI    Medications: I have reviewed the patient's current medications.  Current Outpatient Medications  Medication Sig Dispense Refill   LORazepam  (ATIVAN ) 0.5 MG tablet Take 1 tablet (0.5 mg total) by mouth daily as needed for anxiety. 15 tablet 2   [name] 3-0.02 MG tablet Take 1 tablet by mouth daily.     spironolactone (ALDACTONE) 50 MG tablet Take 50 mg by mouth daily.     venlafaxine  XR (EFFEXOR -XR) 75 MG 24 hr capsule TAKE 3 CAPSULES BY MOUTH EVERY MORNING 270 capsule 1   No current facility-administered medications for this visit.    Medication Side Effects: None  Allergies:  Allergies  Allergen Reactions   Penicillin G Rash    Other reaction(s): Other unknown    Amoxicillin Other (See Comments)    unknown Other reaction(s): Rash   Penicillins     No past medical history on file.  No family history on file.  Social History   Socioeconomic History   Marital status: Single    Spouse name: Not on file   Number of children: Not on file  Years of education: Not on file   Highest education level: Not on file  Occupational History   Not on file  Tobacco Use   Smoking status: Never   Smokeless tobacco: Never  Substance and Sexual Activity   Alcohol use: Not on file   Drug use: Not on file   Sexual activity: Not on file  Other Topics Concern   Not on file  Social History Narrative   Not on file   Social Drivers of Health    Financial Resource Strain: Low Risk  (11/09/2021)   Received from [facility], [facility]   Overall Financial Resource Strain (CARDIA)    Difficulty of Paying Living Expenses: Not hard at all  Food Insecurity: Low Risk  (09/14/2022)   Received from [facility]   Hunger Vital Sign    Worried About Running Out of Food in the Last Year: Never true    Ran Out of Food in the Last Year: Never true  Transportation Needs: Not on file (09/14/2022)  Physical Activity: Sufficiently Active (11/09/2021)   Received from [facility], [facility]   Exercise Vital Sign    Days of Exercise per Week: 3 days    Minutes of Exercise per Session: 60 min  Stress: Stress Concern Present (11/09/2021)   Received from [facility], [facility] - Occupational Stress Questionnaire    Feeling of Stress : To some extent  Social Connections: Socially Integrated (11/09/2021)   Received from [facility], [facility]   Social Network    How would you rate your social network (family, work, friends)?: Good participation with social networks  Intimate Partner Violence: Not At Risk (11/09/2021)   Received from [facility], [facility]   HITS    Over the last 12 months how often did your partner physically hurt you?: Never    Over the last 12 months how often did your partner insult you or talk down to you?: Never    Over the last 12 months how often did your partner threaten you with physical harm?: Never    Over the last 12 months how often did your partner scream or curse at you?: Never    Past Medical History, Surgical history, Social history, and Family history were reviewed and updated as appropriate.   Please see review of systems for further details on the patient's review from today.   Objective:   Physical Exam:  There were no vitals taken for this visit.  Physical Exam Constitutional:      General: She is not in acute  distress. Musculoskeletal:        General: No deformity.  Neurological:     Mental Status: She is alert and oriented to person, place, and time.     Coordination: Coordination normal.  Psychiatric:        Attention and Perception: Attention and perception normal. She does not perceive auditory or visual hallucinations.        Mood and Affect: Mood normal. Affect is not labile, blunt, angry or inappropriate.        Speech: Speech normal.        Behavior: Behavior normal.        Thought Content: Thought content normal. Thought content is not paranoid or delusional. Thought content does not include homicidal or suicidal ideation. Thought content does not include homicidal or suicidal plan.        Cognition and Memory: Cognition and memory normal.  Judgment: Judgment normal.     Comments: Insight intact     Lab Review:  No results found for: "NA", "K", "CL", "CO2", "GLUCOSE", "BUN", "CREATININE", "CALCIUM", "PROT", "ALBUMIN", "AST", "ALT", "ALKPHOS", "BILITOT", "GFRNONAA", "GFRAA"  No results found for: "WBC", "RBC", "HGB", "HCT", "PLT", "MCV", "MCH", "MCHC", "RDW", "LYMPHSABS", "MONOABS", "EOSABS", "BASOSABS"  No results found for: "POCLITH", "LITHIUM"   No results found for: "PHENYTOIN", "PHENOBARB", "VALPROATE", "CBMZ"   .res Assessment: Plan:    Plan:  PDMP reviewed  Add Ritalin  10mg  daily - start with a 1/2 tablet daily. Reviewed Gene Sight testing    Ativan  0.5mg  daily as needed for panic attacks - rarely uses.  Effexor  XR 75mg  daily - 3 capsules daily  Working with a therapist.    RTC 2 months  25 minutes spent dedicated to the care of this patient on the date of this encounter to include pre-visit review of records, ordering of medication, post visit documentation, and face-to-face time with the patient discussing GAD, MDD, panic attacks, obssessional thoughts and ADHD. Discussed continuing current medication regimen.  Discussed potential benefits, risk, and  side effects of benzodiazepines to include potential risk of tolerance and dependence, as well as possible drowsiness.  Advised patient not to drive if experiencing drowsiness and to take lowest possible effective dose to minimize risk of dependence and tolerance.   Patient advised to contact office with any questions, adverse effects, or acute worsening in signs and symptoms.  There are no diagnoses linked to this encounter.   Please see After Visit Summary for patient specific instructions.  Future Appointments  Date Time Provider Department Center  05/27/2023 11:30 AM Chenell Lozon Nattalie, NP [facility] None    No orders of the defined types were placed in this encounter.     -------------------------------

## 2023-08-04 ENCOUNTER — Other Ambulatory Visit: Payer: Self-pay | Admitting: Adult Health

## 2023-08-04 DIAGNOSIS — F411 Generalized anxiety disorder: Secondary | ICD-10-CM

## 2023-08-04 DIAGNOSIS — F428 Other obsessive-compulsive disorder: Secondary | ICD-10-CM

## 2023-08-04 DIAGNOSIS — F331 Major depressive disorder, recurrent, moderate: Secondary | ICD-10-CM

## 2023-08-04 DIAGNOSIS — F410 Panic disorder [episodic paroxysmal anxiety] without agoraphobia: Secondary | ICD-10-CM

## 2023-08-04 DIAGNOSIS — F909 Attention-deficit hyperactivity disorder, unspecified type: Secondary | ICD-10-CM

## 2023-08-04 MED ORDER — VENLAFAXINE HCL ER 75 MG PO CP24: ORAL_CAPSULE | ORAL | 0 refills | Status: DC

## 2023-09-05 ENCOUNTER — Telehealth: Payer: Self-pay | Admitting: Adult Health

## 2023-09-05 NOTE — Telephone Encounter (Signed)
 Patient lvm at 4:25 needing a emotional support letter for a cat. PH: [phone]

## 2023-09-05 NOTE — Telephone Encounter (Signed)
 Pt called asking provider to return her call. Needs letter concerning her health. Contact # [phone]

## 2023-09-05 NOTE — Telephone Encounter (Signed)
 Addressed in another message. Is asking for ESA letter.

## 2023-09-05 NOTE — Telephone Encounter (Signed)
 Patient is asking for a ESA letter for her cat for housing.   Spayed female cat 1-YO Ragdoll Madison Wang  She will pick up letter when ready.

## 2023-09-05 NOTE — Telephone Encounter (Signed)
 error

## 2023-09-05 NOTE — Telephone Encounter (Signed)
 LVM to [facility]

## 2023-09-07 NOTE — Telephone Encounter (Signed)
 done

## 2023-09-07 NOTE — Telephone Encounter (Signed)
 Pt's mom called at 3:37p.  I advised per Lauraine that the letter is not ready but will be by tomorrow.  Pt's mom said she will come tomorrow.

## 2023-09-09 ENCOUNTER — Telehealth (INDEPENDENT_AMBULATORY_CARE_PROVIDER_SITE_OTHER): Admitting: Adult Health

## 2023-09-09 ENCOUNTER — Encounter: Payer: Self-pay | Admitting: Adult Health

## 2023-09-09 DIAGNOSIS — F331 Major depressive disorder, recurrent, moderate: Secondary | ICD-10-CM | POA: Diagnosis not present

## 2023-09-09 DIAGNOSIS — F909 Attention-deficit hyperactivity disorder, unspecified type: Secondary | ICD-10-CM

## 2023-09-09 DIAGNOSIS — F410 Panic disorder [episodic paroxysmal anxiety] without agoraphobia: Secondary | ICD-10-CM | POA: Diagnosis not present

## 2023-09-09 DIAGNOSIS — F411 Generalized anxiety disorder: Secondary | ICD-10-CM | POA: Diagnosis not present

## 2023-09-09 DIAGNOSIS — F428 Other obsessive-compulsive disorder: Secondary | ICD-10-CM

## 2023-09-09 MED ORDER — AMPHETAMINE-DEXTROAMPHETAMINE 10 MG PO TABS: 10.0000 mg | ORAL_TABLET | Freq: Every day | ORAL | 0 refills | Status: AC

## 2023-09-09 MED ORDER — LORAZEPAM 0.5 MG PO TABS
0.5000 mg | ORAL_TABLET | Freq: Every day | ORAL | 2 refills | Status: AC | PRN
Start: 2023-09-09 — End: ?

## 2023-09-09 MED ORDER — VENLAFAXINE HCL ER 75 MG PO CP24: ORAL_CAPSULE | ORAL | 1 refills | Status: AC

## 2023-09-09 NOTE — Progress Notes (Signed)
 Madison Wang [id number] 2000-12-06 23 y.o.  Virtual Visit via Video Note  I connected with pt @ on 09/09/23 at  3:00 PM EDT by a video enabled telemedicine application and verified that I am speaking with the correct person using two identifiers.   I discussed the limitations of evaluation and management by telemedicine and the availability of in person appointments. The patient expressed understanding and agreed to proceed.  I discussed the assessment and treatment plan with the patient. The patient was provided an opportunity to ask questions and all were answered. The patient agreed with the plan and demonstrated an understanding of the instructions.   The patient was advised to call back or seek an in-person evaluation if the symptoms worsen or if the condition fails to improve as anticipated.  I provided 25 minutes of non-face-to-face time during this encounter.  The patient was located at home.  The provider was located at [facility].   Angeline LOISE Sayers, NP   Subjective:   Patient ID:  Madison Wang is a 23 y.o. (DOB Mar 22, 2000) female.  Chief Complaint: No chief complaint on file.   HPI [employer] presents for follow-up of GAD, MDD, panic attacks, obssessional thoughts, and ADHD.  Describes mood today as ok. Pleasant. Reports decreased tearfulness. Mood symptoms - denies depression and irritability. Reports situational anxiety. Reports stable interest and motivation. Reports one recent panic attack. Reports some worry, rumination and over thinking. Reports decreased obsessional thoughts and acts. Reports some tics - happens more with stress. Reports mood as stable. Stating I feel like I'm doing ok - feeling pretty positive. Taking medications as prescribed. Energy levels stable. Active, plans to start exercising. Enjoys some usual interests and activities. Single. Lives with parents - 2 dogs. Spending time with family and friends. Appetite adequate.  Weight stable - 150's - 67. Sleeps well most nights. Averages 9 hours. Reports some daytime napping. Reports difficulties with  focus and concentration. Diagnosed with ADD in 3rd grade - re-evaluated in the 8th grade. Completing tasks. Managing aspects of household. Plans to attend [employer] - online. Working full time - pre school. Denies SI or HI.  Denies AH or VH. Denies self harm. Denies substance use.  Previous medication trials: Zoloft   Review of Systems:  Review of Systems  Musculoskeletal:  Negative for gait problem.  Neurological:  Negative for tremors.  Psychiatric/Behavioral:         Please refer to HPI   Medications: I have reviewed the patient's current medications.  Current Outpatient Medications  Medication Sig Dispense Refill   amphetamine -dextroamphetamine  (ADDERALL) 10 MG tablet Take 1 tablet (10 mg total) by mouth daily. 30 tablet 0   LORazepam  (ATIVAN ) 0.5 MG tablet Take 1 tablet (0.5 mg total) by mouth daily as needed for anxiety. 15 tablet 2   [name] 3-0.02 MG tablet Take 1 tablet by mouth daily.     spironolactone (ALDACTONE) 50 MG tablet Take 50 mg by mouth daily.     venlafaxine  XR (EFFEXOR -XR) 75 MG 24 hr capsule TAKE 3 CAPSULES BY MOUTH EVERY MORNING 270 capsule 1   No current facility-administered medications for this visit.    Medication Side Effects: None  Allergies:  Allergies  Allergen Reactions   Penicillin G Rash    Other reaction(s): Other unknown    Amoxicillin Other (See Comments)    unknown Other reaction(s): Rash   Penicillins     No past medical history on file.  No family history on file.  Social History   Socioeconomic History   Marital status: Single    Spouse name: Not on file   Number of children: Not on file   Years of education: Not on file   Highest education level: Not on file  Occupational History   Not on file  Tobacco Use   Smoking status: Never   Smokeless tobacco: Never  Substance and Sexual Activity    Alcohol use: Not on file   Drug use: Not on file   Sexual activity: Not on file  Other Topics Concern   Not on file  Social History Narrative   Not on file   Social Drivers of Health   Financial Resource Strain: Low Risk  (11/09/2021)   Received from [facility]   Overall Financial Resource Strain (CARDIA)    Difficulty of Paying Living Expenses: Not hard at all  Food Insecurity: Low Risk  (09/14/2022)   Received from [facility]   Hunger Vital Sign    Within the past 12 months, you worried that your food would run out before you got money to buy more: Never true    Within the past 12 months, the food you bought just didn't last and you didn't have money to get more. : Never true  Transportation Needs: Not on file (09/14/2022)  Physical Activity: Sufficiently Active (11/09/2021)   Received from [facility]   Exercise Vital Sign    On average, how many days per week do you engage in moderate to strenuous exercise (like a brisk walk)?: 3 days    On average, how many minutes do you engage in exercise at this level?: 60 min  Stress: Stress Concern Present (11/09/2021)   Received from [facility] - Occupational Stress Questionnaire    Feeling of Stress : To some extent  Social Connections: Socially Integrated (11/09/2021)   Received from [facility]   Social Network    How would you rate your social network (family, work, friends)?: Good participation with social networks  Intimate Partner Violence: Not At Risk (11/09/2021)   Received from [facility]   HITS    Over the last 12 months how often did your partner physically hurt you?: Never    Over the last 12 months how often did your partner insult you or talk down to you?: Never    Over the last 12 months how often did your partner threaten you with physical harm?: Never    Over the last 12 months how often did your partner scream or curse at you?: Never    Past Medical  History, Surgical history, Social history, and Family history were reviewed and updated as appropriate.   Please see review of systems for further details on the patient's review from today.   Objective:   Physical Exam:  There were no vitals taken for this visit.  Physical Exam Constitutional:      General: She is not in acute distress. Musculoskeletal:        General: No deformity.  Neurological:     Mental Status: She is alert and oriented to person, place, and time.     Coordination: Coordination normal.  Psychiatric:        Attention and Perception: Attention and perception normal. She does not perceive auditory or visual hallucinations.        Mood and Affect: Mood normal. Mood is not anxious or depressed. Affect is not labile, blunt, angry or inappropriate.  Speech: Speech normal.        Behavior: Behavior normal.        Thought Content: Thought content normal. Thought content is not paranoid or delusional. Thought content does not include homicidal or suicidal ideation. Thought content does not include homicidal or suicidal plan.        Cognition and Memory: Cognition and memory normal.        Judgment: Judgment normal.     Comments: Insight intact     Lab Review:  No results found for: NA, K, CL, CO2, GLUCOSE, BUN, CREATININE, CALCIUM, PROT, ALBUMIN, AST, ALT, ALKPHOS, BILITOT, GFRNONAA, GFRAA  No results found for: WBC, RBC, HGB, HCT, PLT, MCV, MCH, MCHC, RDW, LYMPHSABS, MONOABS, EOSABS, BASOSABS  No results found for: POCLITH, LITHIUM   No results found for: PHENYTOIN, PHENOBARB, VALPROATE, CBMZ   .res Assessment: Plan:    Plan:  PDMP reviewed  D/C Ritalin  10mg  daily - increased anxiety Add Adderall 10mg  daily   Effexor  XR 75mg  daily - 3 capsules daily  Ativan  0.5mg  daily as needed for panic attacks - rarely uses.  Working with a Paramedic.    RTC 3 months  25 minutes spent  dedicated to the care of this patient on the date of this encounter to include pre-visit review of records, ordering of medication, post visit documentation, and face-to-face time with the patient discussing GAD, MDD, panic attacks, obssessional thoughts and ADHD. Discussed adding Adderall 10mg  daily for ADD symptoms.  Discussed potential benefits, risk, and side effects of benzodiazepines to include potential risk of tolerance and dependence, as well as possible drowsiness. Advised patient not to drive if experiencing drowsiness and to take lowest possible effective dose to minimize risk of dependence and tolerance.   Patient advised to contact office with any questions, adverse effects, or acute worsening in signs and symptoms.  Diagnoses and all orders for this visit:  Major depressive disorder, recurrent episode, moderate (HCC) -     venlafaxine  XR (EFFEXOR -XR) 75 MG 24 hr capsule; TAKE 3 CAPSULES BY MOUTH EVERY MORNING  Generalized anxiety disorder -     venlafaxine  XR (EFFEXOR -XR) 75 MG 24 hr capsule; TAKE 3 CAPSULES BY MOUTH EVERY MORNING  Panic attacks -     LORazepam  (ATIVAN ) 0.5 MG tablet; Take 1 tablet (0.5 mg total) by mouth daily as needed for anxiety. -     venlafaxine  XR (EFFEXOR -XR) 75 MG 24 hr capsule; TAKE 3 CAPSULES BY MOUTH EVERY MORNING  Attention deficit hyperactivity disorder (ADHD), unspecified ADHD type -     venlafaxine  XR (EFFEXOR -XR) 75 MG 24 hr capsule; TAKE 3 CAPSULES BY MOUTH EVERY MORNING -     amphetamine -dextroamphetamine  (ADDERALL) 10 MG tablet; Take 1 tablet (10 mg total) by mouth daily.  Obsessional thoughts -     venlafaxine  XR (EFFEXOR -XR) 75 MG 24 hr capsule; TAKE 3 CAPSULES BY MOUTH EVERY MORNING     Please see After Visit Summary for patient specific instructions.  No future appointments.   No orders of the defined types were placed in this encounter.     -------------------------------
